# Patient Record
Sex: Male | Born: 1975 | Race: White | Hispanic: No | Marital: Single | Smoking: Former smoker
Health system: Southern US, Community
[De-identification: ages and names within clinical notes are randomized; demographics above are authoritative.]

## PROBLEM LIST (undated history)

## (undated) DIAGNOSIS — I10 Essential (primary) hypertension: Secondary | ICD-10-CM

---

## 2018-04-26 ENCOUNTER — Emergency Department: Payer: BLUE CROSS/BLUE SHIELD

## 2018-04-26 ENCOUNTER — Encounter: Payer: Self-pay | Admitting: Emergency Medicine

## 2018-04-26 ENCOUNTER — Other Ambulatory Visit: Payer: Self-pay

## 2018-04-26 ENCOUNTER — Inpatient Hospital Stay
Admission: EM | Admit: 2018-04-26 | Discharge: 2018-04-28 | DRG: 494 | Disposition: A | Discharge status: Home / Self Care | Payer: BLUE CROSS/BLUE SHIELD | Attending: Surgery | Admitting: Surgery

## 2018-04-26 ENCOUNTER — Inpatient Hospital Stay: Payer: BLUE CROSS/BLUE SHIELD

## 2018-04-26 DIAGNOSIS — Z882 Allergy status to sulfonamides status: Secondary | ICD-10-CM

## 2018-04-26 DIAGNOSIS — S82202A Unspecified fracture of shaft of left tibia, initial encounter for closed fracture: Secondary | ICD-10-CM | POA: Diagnosis present

## 2018-04-26 DIAGNOSIS — Z79899 Other long term (current) drug therapy: Secondary | ICD-10-CM | POA: Diagnosis not present

## 2018-04-26 DIAGNOSIS — Y9351 Activity, roller skating (inline) and skateboarding: Secondary | ICD-10-CM | POA: Diagnosis not present

## 2018-04-26 DIAGNOSIS — I10 Essential (primary) hypertension: Secondary | ICD-10-CM | POA: Diagnosis present

## 2018-04-26 DIAGNOSIS — S82292A Other fracture of shaft of left tibia, initial encounter for closed fracture: Principal | ICD-10-CM | POA: Diagnosis present

## 2018-04-26 DIAGNOSIS — S82492A Other fracture of shaft of left fibula, initial encounter for closed fracture: Secondary | ICD-10-CM | POA: Diagnosis present

## 2018-04-26 DIAGNOSIS — Z87891 Personal history of nicotine dependence: Secondary | ICD-10-CM

## 2018-04-26 DIAGNOSIS — T148XXA Other injury of unspecified body region, initial encounter: Secondary | ICD-10-CM

## 2018-04-26 HISTORY — DX: Essential (primary) hypertension: I10

## 2018-04-26 LAB — CBC WITH DIFFERENTIAL/PLATELET
Abs Immature Granulocytes: 0.08 10*3/uL — ABNORMAL HIGH (ref 0.00–0.07)
BASOS ABS: 0.1 10*3/uL (ref 0.0–0.1)
Basophils Relative: 1 %
EOS ABS: 0.2 10*3/uL (ref 0.0–0.5)
Eosinophils Relative: 2 %
HCT: 43 % (ref 39.0–52.0)
Hemoglobin: 14.6 g/dL (ref 13.0–17.0)
IMMATURE GRANULOCYTES: 1 %
Lymphocytes Relative: 21 %
Lymphs Abs: 2.6 10*3/uL (ref 0.7–4.0)
MCH: 30.7 pg (ref 26.0–34.0)
MCHC: 34 g/dL (ref 30.0–36.0)
MCV: 90.5 fL (ref 80.0–100.0)
Monocytes Absolute: 0.8 10*3/uL (ref 0.1–1.0)
Monocytes Relative: 7 %
NEUTROS PCT: 68 %
NRBC: 0 % (ref 0.0–0.2)
Neutro Abs: 8.4 10*3/uL — ABNORMAL HIGH (ref 1.7–7.7)
PLATELETS: 328 10*3/uL (ref 150–400)
RBC: 4.75 MIL/uL (ref 4.22–5.81)
RDW: 11.9 % (ref 11.5–15.5)
WBC: 12.2 10*3/uL — AB (ref 4.0–10.5)

## 2018-04-26 LAB — COMPREHENSIVE METABOLIC PANEL
ALK PHOS: 67 U/L (ref 38–126)
ALT: 22 U/L (ref 0–44)
ANION GAP: 11 (ref 5–15)
AST: 27 U/L (ref 15–41)
Albumin: 4.5 g/dL (ref 3.5–5.0)
BILIRUBIN TOTAL: 1.1 mg/dL (ref 0.3–1.2)
BUN: 11 mg/dL (ref 6–20)
CALCIUM: 9 mg/dL (ref 8.9–10.3)
CO2: 25 mmol/L (ref 22–32)
Chloride: 104 mmol/L (ref 98–111)
Creatinine, Ser: 0.87 mg/dL (ref 0.61–1.24)
Glucose, Bld: 151 mg/dL — ABNORMAL HIGH (ref 70–99)
Potassium: 3.6 mmol/L (ref 3.5–5.1)
SODIUM: 140 mmol/L (ref 135–145)
TOTAL PROTEIN: 7.4 g/dL (ref 6.5–8.1)

## 2018-04-26 LAB — PROTIME-INR
INR: 1.02
Prothrombin Time: 13.3 seconds (ref 11.4–15.2)

## 2018-04-26 LAB — SURGICAL PCR SCREEN
MRSA, PCR: NEGATIVE
STAPHYLOCOCCUS AUREUS: NEGATIVE

## 2018-04-26 MED ORDER — HYDROMORPHONE HCL 1 MG/ML IJ SOLN
INTRAMUSCULAR | Status: AC
  Filled 2018-04-26: qty 1

## 2018-04-26 MED ORDER — ONDANSETRON HCL 4 MG/2ML IJ SOLN
4.0000 mg | Freq: Once | INTRAMUSCULAR | Status: AC
  Administered 2018-04-26: 4 mg via INTRAVENOUS

## 2018-04-26 MED ORDER — ONDANSETRON HCL 4 MG/2ML IJ SOLN
INTRAMUSCULAR | Status: AC
  Filled 2018-04-26: qty 2

## 2018-04-26 MED ORDER — HYDROMORPHONE HCL 1 MG/ML IJ SOLN
0.5000 mg | Freq: Once | INTRAMUSCULAR | Status: AC
  Administered 2018-04-26: 0.5 mg via INTRAVENOUS

## 2018-04-26 MED ORDER — GABAPENTIN 400 MG PO CAPS
400.0000 mg | ORAL_CAPSULE | Freq: Two times a day (BID) | ORAL | Status: DC
  Administered 2018-04-26: 400 mg via ORAL
  Filled 2018-04-26: qty 1

## 2018-04-26 MED ORDER — HYDROMORPHONE HCL 1 MG/ML IJ SOLN
1.0000 mg | Freq: Once | INTRAMUSCULAR | Status: AC
  Administered 2018-04-26: 1 mg via INTRAVENOUS
  Filled 2018-04-26: qty 1

## 2018-04-26 MED ORDER — MORPHINE SULFATE (PF) 2 MG/ML IV SOLN
1.0000 mg | INTRAVENOUS | Status: DC | PRN
  Administered 2018-04-26 – 2018-04-27 (×5): 1 mg via INTRAVENOUS
  Filled 2018-04-26 (×5): qty 1

## 2018-04-26 MED ORDER — HYDROMORPHONE HCL 1 MG/ML PO LIQD
2.0000 mg | Freq: Once | ORAL | Status: AC
  Administered 2018-04-26: 2 mg via ORAL

## 2018-04-26 MED ORDER — CLINDAMYCIN PHOSPHATE 900 MG/50ML IV SOLN
900.0000 mg | INTRAVENOUS | Status: AC
  Administered 2018-04-27: 900 mg via INTRAVENOUS
  Filled 2018-04-26: qty 50

## 2018-04-26 MED ORDER — CHLORHEXIDINE GLUCONATE 4 % EX LIQD
60.0000 mL | Freq: Once | CUTANEOUS | Status: AC
  Administered 2018-04-27: 4 via TOPICAL

## 2018-04-26 MED ORDER — CEFAZOLIN SODIUM-DEXTROSE 2-4 GM/100ML-% IV SOLN
2.0000 g | INTRAVENOUS | Status: AC
  Administered 2018-04-27: 2 g via INTRAVENOUS
  Filled 2018-04-26: qty 100

## 2018-04-26 MED ORDER — MUPIROCIN 2 % EX OINT: 1.0000 "application " | TOPICAL_OINTMENT | Freq: Two times a day (BID) | CUTANEOUS | Status: DC

## 2018-04-26 MED ORDER — HYDROMORPHONE HCL 1 MG/ML IJ SOLN
INTRAMUSCULAR | Status: AC
  Administered 2018-04-26: 2 mg
  Filled 2018-04-26: qty 1

## 2018-04-26 MED ORDER — MELOXICAM 7.5 MG PO TABS
15.0000 mg | ORAL_TABLET | ORAL | Status: AC
  Administered 2018-04-27: 15 mg via ORAL
  Filled 2018-04-26: qty 2

## 2018-04-26 MED ORDER — HYDROCODONE-ACETAMINOPHEN 7.5-325 MG PO TABS
1.0000 | ORAL_TABLET | ORAL | Status: DC | PRN
  Administered 2018-04-26: 1 via ORAL
  Filled 2018-04-26: qty 1

## 2018-04-26 MED ORDER — SODIUM CHLORIDE 0.9 % IV SOLN
INTRAVENOUS | Status: DC
  Administered 2018-04-26 – 2018-04-27 (×2): via INTRAVENOUS

## 2018-04-26 NOTE — ED Notes (Signed)
Long leg posterior and stirrup splint applied

## 2018-04-26 NOTE — ED Triage Notes (Signed)
Pt presents to ED via [facility] with c/o fall roller skating. Per EMS pt has obvious deformity approx 4-5 inchest above his ankle. Per EMS pt has of fentanyl en route. Per EMS +pulses and cap refill < 3 seconds. Pt denies hitting head or other injury after the fall.

## 2018-04-26 NOTE — Progress Notes (Signed)
Patient request a [facility] ortho surgeon to perform procedure.

## 2018-04-26 NOTE — ED Notes (Signed)
Unable to visualize deformity secondary to leg splint that is in place.

## 2018-04-26 NOTE — ED Notes (Signed)
Transport to floor room 137.AS

## 2018-04-26 NOTE — ED Provider Notes (Signed)
[facility] Emergency Department Provider Note   ____________________________________________   First MD Initiated Contact with Patient 04/26/18 1826     (approximate)  I have reviewed the triage vital signs and the nursing notes.   HISTORY  Chief Complaint Fall   Hpi Anthony Abbott is a 42 y.o. male who was rollerskating with his children.  He fell and twisted his leg up underneath of him.  He does not remember hitting anything but his leg.  He did not pass out did not hit his head.  He has a lot of pain in his leg.  He report he had to pull the leg out from underneath of him and straighten it out as it was crooked.  Pain is severe in the left lower leg.   Past Medical History:  Diagnosis Date  . Hypertension     Patient Active Problem List   Diagnosis Date Noted  . Closed left tibial fracture 04/26/2018    History reviewed. No pertinent surgical history.  Prior to Admission medications   Medication Sig Start Date End Date Taking? Authorizing Provider  ibuprofen (ADVIL,MOTRIN) 200 MG tablet Take 200-400 mg by mouth every 6 (six) hours as needed for mild pain.   Yes [provider]  losartan (COZAAR) 25 MG tablet Take 25 mg by mouth daily. 03/31/18  Yes [provider]    Allergies Sulfa antibiotics  History reviewed. No pertinent family history.  Social History Social History   Tobacco Use  . Smoking status: Former Games developer  . Smokeless tobacco: Never Used  Substance Use Topics  . Alcohol use: Yes    Comment: Socially  . Drug use: Not Currently    Review of Systems  Constitutional: No fever/chills Eyes: No visual changes. ENT: No sore throat. Cardiovascular: Denies chest pain. Respiratory: Denies shortness of breath. Gastrointestinal: No abdominal pain.  No nausea, no vomiting.  No diarrhea.  No constipation. Genitourinary: Negative for dysuria. Musculoskeletal: Negative for back pain. Skin: Negative for  rash. Neurological: Negative for headaches, focal weakness  ____________________________________________   PHYSICAL EXAM:  VITAL SIGNS: ED Triage Vitals  Enc Vitals Group     BP 04/26/18 1717 (!) 152/103     Pulse Rate 04/26/18 1715 (!) 103     Resp 04/26/18 1717 20     Temp 04/26/18 1717 98.2 F (36.8 C)     Temp Source 04/26/18 1717 Oral     SpO2 04/26/18 1715 98 %     Weight 04/26/18 1718 190 lb (86.2 kg)     Height 04/26/18 1718 5\' 11"  (1.803 m)     Head Circumference --      Peak Flow --      Pain Score 04/26/18 1718 8     Pain Loc --      Pain Edu? --      Excl. in GC? --     Constitutional: Alert and oriented.  In pain from his leg Eyes: Conjunctivae are normal.  Head: Atraumatic. Nose: No congestion/rhinnorhea. Mouth/Throat: Mucous membranes are moist.  Oropharynx non-erythematous. Neck: No stridor.  Cardiovascular: Normal rate, regular rhythm. Grossly normal heart sounds.  Good peripheral circulation. Respiratory: Normal respiratory effort.  No retractions. Lungs CTAB. Gastrointestinal: Soft and nontender. No distention. No abdominal bruits. No CVA tenderness. Musculoskeletal: Patient with deformity of left lower leg.  Capillary refill and distal pulse and sensation are intact. Neurologic:  Normal speech and language. No gross focal neurologic deficits are appreciated.  Skin:  Skin is  warm, dry and intact. No rash noted. Psychiatric: Mood and affect are normal. Speech and behavior are normal.  ____________________________________________   LABS (all labs ordered are listed, but only abnormal results are displayed)  Labs Reviewed  COMPREHENSIVE METABOLIC PANEL - Abnormal; Notable for the following components:      Result Value   Glucose, Bld 151 (*)    All other components within normal limits  CBC WITH DIFFERENTIAL/PLATELET - Abnormal; Notable for the following components:   WBC 12.2 (*)    Neutro Abs 8.4 (*)    Abs Immature Granulocytes 0.08 (*)    All  other components within normal limits   ____________________________________________  EKG EKG read and interpreted by me shows normal sinus rhythm rate of 99 normal axis normal EKG  ____________________________________________  RADIOLOGY  ED MD interpretation: X-ray read by radiology reviewed by me shows a comminuted mid shaft fracture of the fibula and a midshaft fracture of the tibia as well. Post reduction view shows somewhat better approximation of the fragments.  Official radiology report(s): Dg Tibia/fibula Left  Result Date: 04/26/2018 CLINICAL DATA:  Status post reduction of tibial and fibular fractures. EXAM: LEFT TIBIA AND FIBULA - 2 VIEW COMPARISON:  Radiographs of same day. FINDINGS: There is been significant improvement in alignment of comminuted fractures involving the distal left fibula and tibia. The leg has been splinted and immobilized. IMPRESSION: Improved alignment of comminuted fractures involving the distal left tibia and fibula. Electronically Signed   By: Lupita Raider, M.D.   On: 04/26/2018 19:44   Dg Tibia/fibula Left  Result Date: 04/26/2018 CLINICAL DATA:  Fall while roller-skating. EXAM: LEFT TIBIA AND FIBULA - 2 VIEW COMPARISON:  None. FINDINGS: Displaced and mildly angulated spiral fracture of the distal third of the tibia, approximately 10 cm above the ankle mortise. Slight comminution. 1 cm displacement between the proximal and distal fragments. Additional comminuted mid shaft fracture of the fibula, greater than 1 cm displacement of the proximal to distal fragments, mildly angulated IMPRESSION: Mildly comminuted and displaced fractures of the distal tibia and fibula. Electronically Signed   By: Elsie Stain M.D.   On: 04/26/2018 18:48    ____________________________________________   PROCEDURES  Procedure(s) performed: Discussed patient with Dr. Hyacinth Meeker.  He wants a posterior splint and a stirrup splint placed.  Patient given more pain medicine and  then a leg supported at the calf and lifted by the toes was wrapped with gauze under my direct supervision and then splinted as Dr. Hyacinth Meeker wished.  Patient tolerated this fairly well after having received the extra pain medications.  After the splinting was done sensation capillary refill and pulse remained intact in the foot.  X-rays show better approximation of the fracture.  Procedures  Critical Care performed: Critical care time 25 minutes.  This included supervising the splinting and holding the leg for the splinters. ____________________________________________   INITIAL IMPRESSION / ASSESSMENT AND PLAN / ED COURSE  \Warned the patient about the symptoms of compartment syndrome.  Patient will go upstairs Dr. Hyacinth Meeker plan on doing surgery on him tomorrow.       ____________________________________________   FINAL CLINICAL IMPRESSION(S) / ED DIAGNOSES  Final diagnoses:  Closed fracture of shaft of left tibia, unspecified fracture morphology, initial encounter     ED Discharge Orders    None       Note:  This document was prepared using Dragon voice recognition software and may include unintentional dictation errors.    Arnaldo Natal, MD  04/26/18 2109  

## 2018-04-26 NOTE — Progress Notes (Signed)
Called ED for report. Awaiting call back.

## 2018-04-27 ENCOUNTER — Inpatient Hospital Stay: Payer: BLUE CROSS/BLUE SHIELD

## 2018-04-27 ENCOUNTER — Other Ambulatory Visit: Payer: Self-pay

## 2018-04-27 ENCOUNTER — Inpatient Hospital Stay: Payer: BLUE CROSS/BLUE SHIELD | Admitting: Anesthesiology

## 2018-04-27 ENCOUNTER — Encounter
Admission: EM | Disposition: A | Discharge status: Home / Self Care | Payer: Self-pay | Source: Home / Self Care | Attending: Surgery

## 2018-04-27 ENCOUNTER — Encounter: Payer: Self-pay | Admitting: *Deleted

## 2018-04-27 HISTORY — PX: TIBIA IM NAIL INSERTION: SHX2516

## 2018-04-27 LAB — URINALYSIS, ROUTINE W REFLEX MICROSCOPIC
BACTERIA UA: NONE SEEN
Bilirubin Urine: NEGATIVE
Glucose, UA: NEGATIVE mg/dL
KETONES UR: NEGATIVE mg/dL
Leukocytes, UA: NEGATIVE
Nitrite: NEGATIVE
PROTEIN: NEGATIVE mg/dL
Specific Gravity, Urine: 1.011 (ref 1.005–1.030)
pH: 7 (ref 5.0–8.0)

## 2018-04-27 SURGERY — INSERTION, INTRAMEDULLARY ROD, TIBIA
Anesthesia: General | Site: Leg Lower | Laterality: Left

## 2018-04-27 MED ORDER — NEOMYCIN-POLYMYXIN B GU 40-200000 IR SOLN
Status: DC | PRN
  Administered 2018-04-27: 4 mL

## 2018-04-27 MED ORDER — MAGNESIUM HYDROXIDE 400 MG/5ML PO SUSP: 30.0000 mL | Freq: Every day | ORAL | Status: DC | PRN

## 2018-04-27 MED ORDER — OXYCODONE HCL 5 MG PO TABS: 5.0000 mg | ORAL_TABLET | Freq: Once | ORAL | Status: DC | PRN

## 2018-04-27 MED ORDER — METOCLOPRAMIDE HCL 5 MG/ML IJ SOLN: 5.0000 mg | Freq: Three times a day (TID) | INTRAMUSCULAR | Status: DC | PRN

## 2018-04-27 MED ORDER — ROCURONIUM BROMIDE 100 MG/10ML IV SOLN
INTRAVENOUS | Status: DC | PRN
  Administered 2018-04-27: 50 mg via INTRAVENOUS

## 2018-04-27 MED ORDER — FENTANYL CITRATE (PF) 100 MCG/2ML IJ SOLN
INTRAMUSCULAR | Status: AC
  Filled 2018-04-27: qty 2

## 2018-04-27 MED ORDER — HYDROMORPHONE HCL 1 MG/ML IJ SOLN: 0.5000 mg | INTRAMUSCULAR | Status: DC | PRN

## 2018-04-27 MED ORDER — ONDANSETRON HCL 4 MG/2ML IJ SOLN: 4.0000 mg | Freq: Four times a day (QID) | INTRAMUSCULAR | Status: DC | PRN

## 2018-04-27 MED ORDER — KETOROLAC TROMETHAMINE 15 MG/ML IJ SOLN
15.0000 mg | Freq: Four times a day (QID) | INTRAMUSCULAR | Status: AC
  Administered 2018-04-27 – 2018-04-28 (×4): 15 mg via INTRAVENOUS
  Filled 2018-04-27 (×4): qty 1

## 2018-04-27 MED ORDER — PROPOFOL 10 MG/ML IV BOLUS
INTRAVENOUS | Status: DC | PRN
  Administered 2018-04-27: 150 mg via INTRAVENOUS

## 2018-04-27 MED ORDER — TRAMADOL HCL 50 MG PO TABS
50.0000 mg | ORAL_TABLET | Freq: Four times a day (QID) | ORAL | Status: DC | PRN
  Administered 2018-04-27 – 2018-04-28 (×2): 50 mg via ORAL
  Filled 2018-04-27 (×2): qty 1

## 2018-04-27 MED ORDER — ONDANSETRON HCL 4 MG/2ML IJ SOLN
INTRAMUSCULAR | Status: DC | PRN
  Administered 2018-04-27: 4 mg via INTRAVENOUS

## 2018-04-27 MED ORDER — FENTANYL CITRATE (PF) 100 MCG/2ML IJ SOLN: 25.0000 ug | INTRAMUSCULAR | Status: DC | PRN

## 2018-04-27 MED ORDER — SODIUM CHLORIDE 0.9 % IV SOLN
INTRAVENOUS | Status: DC
  Administered 2018-04-27: 18:00:00 via INTRAVENOUS

## 2018-04-27 MED ORDER — PROMETHAZINE HCL 25 MG/ML IJ SOLN: 6.2500 mg | INTRAMUSCULAR | Status: DC | PRN

## 2018-04-27 MED ORDER — MIDAZOLAM HCL 2 MG/2ML IJ SOLN
INTRAMUSCULAR | Status: DC | PRN
  Administered 2018-04-27: 2 mg via INTRAVENOUS

## 2018-04-27 MED ORDER — ROPIVACAINE HCL 5 MG/ML IJ SOLN
INTRAMUSCULAR | Status: DC | PRN
  Administered 2018-04-27: 30 mL via PERINEURAL

## 2018-04-27 MED ORDER — DEXAMETHASONE SODIUM PHOSPHATE 10 MG/ML IJ SOLN
INTRAMUSCULAR | Status: DC | PRN
  Administered 2018-04-27: 10 mg via INTRAVENOUS

## 2018-04-27 MED ORDER — DIPHENHYDRAMINE HCL 12.5 MG/5ML PO ELIX: 12.5000 mg | ORAL_SOLUTION | ORAL | Status: DC | PRN

## 2018-04-27 MED ORDER — OXYCODONE HCL 5 MG/5ML PO SOLN: 5.0000 mg | Freq: Once | ORAL | Status: DC | PRN

## 2018-04-27 MED ORDER — HYDROMORPHONE HCL 1 MG/ML IJ SOLN
INTRAMUSCULAR | Status: DC | PRN
  Administered 2018-04-27 (×2): 0.5 mg via INTRAVENOUS

## 2018-04-27 MED ORDER — DEXAMETHASONE SODIUM PHOSPHATE 10 MG/ML IJ SOLN
INTRAMUSCULAR | Status: AC
  Filled 2018-04-27: qty 1

## 2018-04-27 MED ORDER — EPINEPHRINE PF 1 MG/ML IJ SOLN
INTRAMUSCULAR | Status: AC
  Filled 2018-04-27: qty 1

## 2018-04-27 MED ORDER — BUPIVACAINE HCL (PF) 0.5 % IJ SOLN
INTRAMUSCULAR | Status: DC | PRN
  Administered 2018-04-27: 20 mL

## 2018-04-27 MED ORDER — METOCLOPRAMIDE HCL 10 MG PO TABS: 5.0000 mg | ORAL_TABLET | Freq: Three times a day (TID) | ORAL | Status: DC | PRN

## 2018-04-27 MED ORDER — ACETAMINOPHEN 10 MG/ML IV SOLN
INTRAVENOUS | Status: AC
  Filled 2018-04-27: qty 100

## 2018-04-27 MED ORDER — DOCUSATE SODIUM 100 MG PO CAPS
100.0000 mg | ORAL_CAPSULE | Freq: Two times a day (BID) | ORAL | Status: DC
  Administered 2018-04-27 – 2018-04-28 (×2): 100 mg via ORAL
  Filled 2018-04-27 (×2): qty 1

## 2018-04-27 MED ORDER — NEOMYCIN-POLYMYXIN B GU 40-200000 IR SOLN
Status: AC
  Filled 2018-04-27: qty 20

## 2018-04-27 MED ORDER — CEFAZOLIN SODIUM-DEXTROSE 2-4 GM/100ML-% IV SOLN
2.0000 g | Freq: Four times a day (QID) | INTRAVENOUS | Status: AC
  Administered 2018-04-27 – 2018-04-28 (×3): 2 g via INTRAVENOUS
  Filled 2018-04-27 (×3): qty 100

## 2018-04-27 MED ORDER — LIDOCAINE HCL (PF) 2 % IJ SOLN
INTRAMUSCULAR | Status: AC
  Filled 2018-04-27: qty 10

## 2018-04-27 MED ORDER — ACETAMINOPHEN 500 MG PO TABS
1000.0000 mg | ORAL_TABLET | Freq: Four times a day (QID) | ORAL | Status: DC
  Administered 2018-04-27 – 2018-04-28 (×3): 1000 mg via ORAL
  Filled 2018-04-27 (×4): qty 2

## 2018-04-27 MED ORDER — OXYCODONE HCL 5 MG PO TABS
5.0000 mg | ORAL_TABLET | ORAL | Status: DC | PRN
  Administered 2018-04-28: 5 mg via ORAL
  Filled 2018-04-27: qty 1

## 2018-04-27 MED ORDER — ONDANSETRON HCL 4 MG/2ML IJ SOLN
4.0000 mg | Freq: Four times a day (QID) | INTRAMUSCULAR | Status: DC | PRN
  Filled 2018-04-27: qty 2

## 2018-04-27 MED ORDER — HYDROMORPHONE HCL 1 MG/ML IJ SOLN
INTRAMUSCULAR | Status: AC
  Filled 2018-04-27: qty 1

## 2018-04-27 MED ORDER — SUGAMMADEX SODIUM 200 MG/2ML IV SOLN
INTRAVENOUS | Status: DC | PRN
  Administered 2018-04-27: 200 mg via INTRAVENOUS

## 2018-04-27 MED ORDER — ONDANSETRON HCL 4 MG/2ML IJ SOLN
INTRAMUSCULAR | Status: AC
  Filled 2018-04-27: qty 2

## 2018-04-27 MED ORDER — LIDOCAINE HCL (PF) 1 % IJ SOLN
INTRAMUSCULAR | Status: AC
  Filled 2018-04-27: qty 5

## 2018-04-27 MED ORDER — ACETAMINOPHEN 325 MG PO TABS: 325.0000 mg | ORAL_TABLET | Freq: Four times a day (QID) | ORAL | Status: DC | PRN

## 2018-04-27 MED ORDER — FENTANYL CITRATE (PF) 100 MCG/2ML IJ SOLN
INTRAMUSCULAR | Status: DC | PRN
  Administered 2018-04-27: 100 ug via INTRAVENOUS

## 2018-04-27 MED ORDER — PROPOFOL 500 MG/50ML IV EMUL
INTRAVENOUS | Status: AC
  Filled 2018-04-27: qty 50

## 2018-04-27 MED ORDER — ACETAMINOPHEN 10 MG/ML IV SOLN
INTRAVENOUS | Status: DC | PRN
  Administered 2018-04-27: 1000 mg via INTRAVENOUS

## 2018-04-27 MED ORDER — BUPIVACAINE HCL (PF) 0.5 % IJ SOLN
INTRAMUSCULAR | Status: AC
  Filled 2018-04-27: qty 30

## 2018-04-27 MED ORDER — MEPERIDINE HCL 50 MG/ML IJ SOLN: 6.2500 mg | INTRAMUSCULAR | Status: DC | PRN

## 2018-04-27 MED ORDER — MIDAZOLAM HCL 2 MG/2ML IJ SOLN
INTRAMUSCULAR | Status: AC
  Filled 2018-04-27: qty 2

## 2018-04-27 MED ORDER — BISACODYL 10 MG RE SUPP: 10.0000 mg | Freq: Every day | RECTAL | Status: DC | PRN

## 2018-04-27 MED ORDER — FLEET ENEMA 7-19 GM/118ML RE ENEM: 1.0000 | ENEMA | Freq: Once | RECTAL | Status: DC | PRN

## 2018-04-27 MED ORDER — ONDANSETRON HCL 4 MG PO TABS: 4.0000 mg | ORAL_TABLET | Freq: Four times a day (QID) | ORAL | Status: DC | PRN

## 2018-04-27 MED ORDER — LIDOCAINE HCL (CARDIAC) PF 100 MG/5ML IV SOSY
PREFILLED_SYRINGE | INTRAVENOUS | Status: DC | PRN
  Administered 2018-04-27: 100 mg via INTRAVENOUS

## 2018-04-27 MED ORDER — ENOXAPARIN SODIUM 40 MG/0.4ML ~~LOC~~ SOLN
40.0000 mg | SUBCUTANEOUS | Status: DC
  Administered 2018-04-28: 40 mg via SUBCUTANEOUS
  Filled 2018-04-27: qty 0.4

## 2018-04-27 MED ORDER — ROPIVACAINE HCL 5 MG/ML IJ SOLN
INTRAMUSCULAR | Status: AC
  Filled 2018-04-27: qty 30

## 2018-04-27 SURGICAL SUPPLY — 56 items
BANDAGE ACE 4X5 VEL STRL LF (GAUZE/BANDAGES/DRESSINGS) ×3 IMPLANT
BANDAGE ACE 6X5 VEL STRL LF (GAUZE/BANDAGES/DRESSINGS) ×3 IMPLANT
BIT DRILL 3.8X6 NS (BIT) ×2 IMPLANT
BIT DRILL 4.4 NS (BIT) ×2 IMPLANT
BLADE SURG SZ10 CARB STEEL (BLADE) ×6 IMPLANT
BNDG CMPR 75X41 PLY HI ABS (GAUZE/BANDAGES/DRESSINGS) ×1
BNDG ESMARK 6X12 TAN STRL LF (GAUZE/BANDAGES/DRESSINGS) ×3 IMPLANT
BNDG PLASTER FAST 4X5 WHT LF (CAST SUPPLIES) ×6 IMPLANT
BNDG PLASTER FAST 6X5 WHT LF (CAST SUPPLIES) ×4 IMPLANT
BNDG PLSTR 5X4 FST ST WHT LF (CAST SUPPLIES) ×2
BNDG PLSTR 5X6 FST ST WHT LF (CAST SUPPLIES) ×1
BNDG STRETCH 4X75 STRL LF (GAUZE/BANDAGES/DRESSINGS) ×2 IMPLANT
CANISTER SUCT 1200ML W/VALVE (MISCELLANEOUS) ×3 IMPLANT
CHLORAPREP W/TINT 26ML (MISCELLANEOUS) ×3 IMPLANT
COVER WAND RF STERILE (DRAPES) ×3 IMPLANT
CUFF TOURN 24 STER (MISCELLANEOUS) IMPLANT
CUFF TOURN 30 STER DUAL PORT (MISCELLANEOUS) IMPLANT
DRAPE C-ARM XRAY 36X54 (DRAPES) ×3 IMPLANT
DRAPE C-ARMOR (DRAPES) ×2 IMPLANT
DRAPE SHEET LG 3/4 BI-LAMINATE (DRAPES) ×6 IMPLANT
DRAPE TABLE BACK 80X90 (DRAPES) ×3 IMPLANT
DRAPE U-SHAPE 47X51 STRL (DRAPES) ×3 IMPLANT
DRSG OPSITE POSTOP 4X6 (GAUZE/BANDAGES/DRESSINGS) ×2 IMPLANT
ELECT REM PT RETURN 9FT ADLT (ELECTROSURGICAL) ×3
ELECTRODE REM PT RTRN 9FT ADLT (ELECTROSURGICAL) ×1 IMPLANT
GAUZE PETRO XEROFOAM 1X8 (MISCELLANEOUS) ×3 IMPLANT
GAUZE SPONGE 4X4 12PLY STRL (GAUZE/BANDAGES/DRESSINGS) ×3 IMPLANT
GAUZE XEROFORM 4X4 STRL (GAUZE/BANDAGES/DRESSINGS) ×2 IMPLANT
GLOVE BIO SURGEON STRL SZ8 (GLOVE) ×6 IMPLANT
GLOVE INDICATOR 8.0 STRL GRN (GLOVE) ×3 IMPLANT
GLOVE SURG XRAY 8.0 LX (GLOVE) ×3 IMPLANT
GOWN STRL REUS W/ TWL LRG LVL3 (GOWN DISPOSABLE) ×1 IMPLANT
GOWN STRL REUS W/ TWL XL LVL3 (GOWN DISPOSABLE) ×1 IMPLANT
GOWN STRL REUS W/TWL LRG LVL3 (GOWN DISPOSABLE) ×3
GOWN STRL REUS W/TWL XL LVL3 (GOWN DISPOSABLE) ×3
GUIDEPIN 3.2X17.5 THRD DISP (PIN) ×2 IMPLANT
GUIDEWIRE BALL NOSE 80CM (WIRE) ×2 IMPLANT
KIT TURNOVER KIT A (KITS) ×3 IMPLANT
NAIL TIBIAL 9MMX36CM (Nail) ×2 IMPLANT
NEEDLE HYPO 22GX1.5 SAFETY (NEEDLE) ×2 IMPLANT
NS IRRIG 1000ML POUR BTL (IV SOLUTION) ×3 IMPLANT
PAD ABD DERMACEA PRESS 5X9 (GAUZE/BANDAGES/DRESSINGS) ×4 IMPLANT
PADDING CAST BLEND 4X4 NS (MISCELLANEOUS) ×9 IMPLANT
SCREW ACECAP 36MM (Screw) ×2 IMPLANT
SCREW ACECAP 44MM (Screw) ×2 IMPLANT
SCREW ACECAP 48MM (Screw) ×2 IMPLANT
SCREW PROXIMAL DEPUY (Screw) ×6 IMPLANT
SCREW PRXML FT 40X5.5XNS LF (Screw) IMPLANT
SCREW PRXML FT 60X5.5XNS LF (Screw) IMPLANT
SPLINT CAST 1 STEP 5X30 WHT (MISCELLANEOUS) ×4 IMPLANT
SPONGE LAP 18X18 RF (DISPOSABLE) ×3 IMPLANT
STAPLER SKIN PROX 35W (STAPLE) ×3 IMPLANT
STRAP SAFETY 5IN WIDE (MISCELLANEOUS) ×3 IMPLANT
SUT VIC AB 0 CT1 36 (SUTURE) ×3 IMPLANT
SUT VIC AB 2-0 CT1 (SUTURE) ×3 IMPLANT
SYR 10ML LL (SYRINGE) ×2 IMPLANT

## 2018-04-27 NOTE — Anesthesia Procedure Notes (Signed)
Procedure Name: Intubation Date/Time: 04/27/2018 12:29 PM Performed by: Ginger Carne, CRNA Pre-anesthesia Checklist: Patient identified, Emergency Drugs available, Suction available, Patient being monitored and Timeout performed Patient Re-evaluated:Patient Re-evaluated prior to induction Oxygen Delivery Method: Circle system utilized Preoxygenation: Pre-oxygenation with 100% oxygen Induction Type: IV induction Ventilation: Mask ventilation without difficulty and Oral airway inserted - appropriate to patient size Laryngoscope Size: Hyacinth Meeker, 2, McGraph and 3 Grade View: Grade II Tube type: Oral Tube size: 7.5 mm Number of attempts: 2 Airway Equipment and Method: Stylet,  Video-laryngoscopy and Oral airway Placement Confirmation: ETT inserted through vocal cords under direct vision,  positive ETCO2 and breath sounds checked- equal and bilateral Secured at: 21 cm Tube secured with: Tape Dental Injury: Teeth and Oropharynx as per pre-operative assessment and Injury to lip  Difficulty Due To: Difficulty was unanticipated, Difficult Airway- due to anterior larynx and Difficult Airway- due to limited oral opening

## 2018-04-27 NOTE — Op Note (Signed)
04/27/2018  2:39 PM  Patient:   Anthony Abbott  Pre-Op Diagnosis:   Closed displaced midshaft left tib-fib fracture.Marland Kitchen  Post-Op Diagnosis:   Same.  Procedure:   Closed reduction and intramedullary nailing of displaced left midshaft tibial fracture.  Surgeon:   Maryagnes Amos, MD  Assistant:   Joni Fears, PA-S  Anesthesia:   GET  Findings:   As above.  Complications:   None  EBL:   200 cc  Fluids:   650 cc crystalloid  UOP:   None  TT:   None  Drains:   None  Closure:   Staples  Implants:   Biomet 12 x 345 mm Versanail with 3 proximal and 3 distal interlocking screws.  Brief Clinical Note:   The patient is a 42 year old male who sustained the above-noted injury yesterday afternoon while rollerskating. He presented to the emergency room where x-rays demonstrated the above-noted injury. He was placed in a posterior splint with sugar tong supplement and admitted for pain control. The patient presents at this time for definitive management of this injury.  Procedure:   The patient was brought into the operating room and lain in the supine position. After adequate general endotracheal intubation and anesthesia were obtained, the right lower extremity and foot were prepped with ChloraPrep solution before being draped sterilely. Preoperative antibiotics were administered. A timeout was obtained to verify the appropriate surgical site before an approximately 7-8 cm incision was made longitudinally over the anterior aspect of the knee beginning at the inferior pole of the patella and extending just past the tibial tubercle. The incision was carried down through the subcutaneous tissues to expose the patellar tendon. The medial border the patella tendon was identified and this plane developed down to the proximal tibia. A small amount of fat pad was excised to optimize visualization. Under fluoroscopic guidance, a drill bit was placed into the proximal tibia at the midpoint of the tibia  medial laterally and just on the anterior aspect of the plateau in the sagittal plane. After assessing the adequacy of pin position, the drill bit was overreamed with the initial reamer. The beaded guidewire was passed down the tibial shaft, across the fracture site, and into the distal fragment, again under fluoroscopic visualization. The adequacy of pin position was verified fluoroscopically in AP and lateral projections and found to be excellent. The guidewire was measured and found to be 37 cm. The tibial canal then was reamed sequentially beginning with an 8 mm flexible reamer and progressing to a 10.5 mm flexible reamer. The 9 x 360 mm tibial nail was inserted and advanced to within 1 cm of the tibial plafond, monitoring the progression of the nail and the adequacy of fracture alignment in both AP and lateral projections using fluoroscopic imaging while advancing the nail. Once the nail was positioned appropriately, two proximal interlocking screws of appropriate length were placed utilizing the proximal tibial guide. Distally, utilizing the perfect circle technique, two interlocking screws of appropriate length were placed via appropriately placed stab incisions as well. Again, the overall construct was assessed fluoroscopically in AP and lateral projections and found to be excellent.  Note, of the more distal of the two distal interlocking screws, the initial screw that was placed was too long and it was replaced with a shorter screw.  The wounds were copiously irrigated with sterile saline solution using bulb irrigation before the medial retinacular layer was reapproximated using #0 Vicryl interrupted sutures. The subcutaneous tissues were closed in two layers  using 2-0 Vicryl interrupted sutures before the skin was closed using staples. The stab incisions also each were closed using staples. A total of 20 cc of 0.5% plain Sensorcaine was injected in and around each of the incisions to help with  postoperative analgesia. A sterile bulky dressing was applied to the knee and lower leg before the patient was placed into a posterior splint with sugar tong supplement maintaining the ankle in neutral dorsiflexion. The patient then was awakened, extubated, and returned to the recovery room in satisfactory condition after tolerating the procedure well.

## 2018-04-27 NOTE — Anesthesia Postprocedure Evaluation (Signed)
Anesthesia Post Note  Patient: Anthony Abbott  Procedure(s) Performed: INTRAMEDULLARY (IM) NAIL TIBIAL (Left Leg Lower)  Patient location during evaluation: PACU Anesthesia Type: General Level of consciousness: awake and alert Pain management: pain level controlled Vital Signs Assessment: post-procedure vital signs reviewed and stable Respiratory status: spontaneous breathing and respiratory function stable Cardiovascular status: stable Anesthetic complications: no     Last Vitals:  Vitals:   04/27/18 1446 04/27/18 1447  BP:  (!) 155/95  Pulse:  (!) 112  Resp:  16  Temp: (!) 36.4 C   SpO2:  100%    Last Pain:  Vitals:   04/27/18 1447  TempSrc:   PainSc: Asleep                 KEPHART,WILLIAM K

## 2018-04-27 NOTE — Progress Notes (Signed)
Pt returned to unit from PACU just before 5pm. Pt is A&Ox4, VSS. Ace splint intact to left leg. LLE is elevated and ice pack applied. Pt's mom at bedside.

## 2018-04-27 NOTE — Transfer of Care (Signed)
Immediate Anesthesia Transfer of Care Note  Patient: Anthony Abbott  Procedure(s) Performed: INTRAMEDULLARY (IM) NAIL TIBIAL (Left Leg Lower)  Patient Location: PACU  Anesthesia Type:General  Level of Consciousness: awake, alert  and oriented  Airway & Oxygen Therapy: Patient Spontanous Breathing and Patient connected to face mask oxygen  Post-op Assessment: Report given to RN and Post -op Vital signs reviewed and stable  Post vital signs: Reviewed and stable  Last Vitals:  Vitals Value Taken Time  BP 155/95 04/27/2018  2:47 PM  Temp    Pulse 111 04/27/2018  2:47 PM  Resp 17 04/27/2018  2:47 PM  SpO2 100 % 04/27/2018  2:47 PM  Vitals shown include unvalidated device data.  Last Pain:  Vitals:   04/27/18 1123  TempSrc: Tympanic  PainSc: 8       Patients Stated Pain Goal: 2 (04/27/18 0455)  Complications: No apparent anesthesia complications

## 2018-04-27 NOTE — Anesthesia Procedure Notes (Signed)
Anesthesia Regional Block: Popliteal block   Pre-Anesthetic Checklist: ,, timeout performed, Correct Patient, Correct Site, Correct Laterality, Correct Procedure, Correct Position, site marked, Risks and benefits discussed,  Surgical consent,  Pre-op evaluation,  At surgeon's request and post-op pain management  Laterality: Lower and Left  Prep: chloraprep       Needles:  Injection technique: Single-shot  Needle Type: Echogenic Stimulator Needle     Needle Length: 10cm  Needle Gauge: 20     Additional Needles:   Procedures:,,,, ultrasound used (permanent image in chart),,,,  Narrative:  Injection made incrementally with aspirations every 5 mL.  Performed by: Personally  Anesthesiologist: Yevette Edwards, MD  Additional Notes: Functioning IV was confirmed and monitors were applied.  An echogenic needle was used. Sterile prep,hand hygiene and sterile gloves were used. Minimal sedation used for procedure.   No paresthesia endorsed by patient during the procedure.  Negative aspiration and negative test dose prior to incremental administration of local anesthetic. The patient tolerated the procedure well with no immediate complications.

## 2018-04-27 NOTE — Progress Notes (Signed)
Spoke to Dr. Joice Lofts concerning patient's request to have [facility] ortho surgeon on the team. MD agreed and advise that the someone from the [facility] team will assess in the morning. Shortly after this conversation Dr. Ernest Pine called concerning this patient and confirmed that he will see the patient in the morning.

## 2018-04-27 NOTE — Addendum Note (Signed)
Addendum  created 04/27/18 1537 by Yevette Edwards, MD   Child order released for a procedure order, Intraprocedure Blocks edited, Intraprocedure Meds edited, Sign clinical note

## 2018-04-27 NOTE — Anesthesia Post-op Follow-up Note (Signed)
Anesthesia QCDR form completed.        

## 2018-04-27 NOTE — H&P (Signed)
Subjective:  Chief complaint: Left lower leg pain.  The patient is a 42 y.o. male who sustained an injury to the left lower leg yesterday afternoon while rollerskating. He does not recall exactly how it happened but he ended up trying to "unfold my leg from underneath me" he had significant pain so he was brought to the emergency room where x-rays demonstrated a displaced fracture of the left tibia and fibula. The leg was realigned and splinted, and the patient was admitted for definitive management of the injury. The patient denies any associated injury or loss of consciousness associated with the injury, and denies any light-headedness, loss of consciousness, chest pain, or shortness of breath which might have contributed to the injury. In addition, he denies any numbness or paresthesias to his leg at this time.  Patient Active Problem List   Diagnosis Date Noted  . Closed left tibial fracture 04/26/2018   Past Medical History:  Diagnosis Date  . Hypertension     History reviewed. No pertinent surgical history.  Medications Prior to Admission  Medication Sig Dispense Refill Last Dose  . ibuprofen (ADVIL,MOTRIN) 200 MG tablet Take 200-400 mg by mouth every 6 (six) hours as needed for mild pain.     Marland Kitchen losartan (COZAAR) 25 MG tablet Take 25 mg by mouth daily.  1    Allergies  Allergen Reactions  . Sulfa Antibiotics     Social History   Tobacco Use  . Smoking status: Former Games developer  . Smokeless tobacco: Never Used  Substance Use Topics  . Alcohol use: Yes    Comment: Socially    History reviewed. No pertinent family history.   Review of Systems: As noted above. The patient denies any chest pain, shortness of breath, nausea, vomiting, diarrhea, constipation, belly pain, blood in his/her stool, or burning with urination.  Objective: Temp:  [97.8 F (36.6 C)-98.2 F (36.8 C)] 98 F (36.7 C) (11/11 0747) Pulse Rate:  [82-108] 88 (11/11 0747) Resp:  [10-22] 20 (11/11 0747) BP:  (133-169)/(56-103) 152/89 (11/11 0747) SpO2:  [93 %-100 %] 99 % (11/11 0747) Weight:  [86.2 kg-89.4 kg] 89.4 kg (11/10 2214)  Physical Exam: General:  Alert, no acute distress Psychiatric:  Patient is competent for consent with normal mood and affect Cardiovascular:  RRR  Respiratory:  Clear to auscultation. No wheezing. Non-labored breathing GI:  Abdomen is soft and non-tender Skin:  No lesions in the area of chief complaint Neurologic:  Sensation intact distally Lymphatic:  No axillary or cervical lymphadenopathy  Orthopedic Exam:  Orthopedic examination is limited to the left lower extremity and foot.  The patient is a a posterior splint with a sugar tong supplement, maintaining the leg in neutral alignment.  Skin inspection at the proximal distal extent of the splint is unremarkable.  There is no swelling, erythema, ecchymosis, abrasions, or other skin normality is identified.  He is able to dorsiflex and plantarflex his toes, although range of motion is limited due to pain and apprehension.  Sensation is intact to all digits.  He has excellent capillary refill to all digits.  Imaging Review: Recent x-rays of the left tibia and fibula are available for review.  These films demonstrate displaced midshaft fractures of both the tibia and fibula without extension to either articular surface.  No lytic lesions or significant degenerative changes are noted.  Assessment: Closed displaced fractures of left tibia and fibular shafts.  Plan: The treatment options, including both surgical and nonsurgical choices, have been discussed in detail  with the patient and his family. The patient would like to proceed with surgical intervention to include an intramedullary nailing of the left tibial shaft fracture. The risks (including bleeding, infection, nerve and/or blood vessel injury, persistent or recurrent pain, loosening or failure of the metal rod, malunion and/or nonunion, leg length inequality, need  for further surgery to include possible hardware removal, blood clots, strokes, heart attacks or arrhythmias, pneumonia, etc.) and benefits of the surgical procedure were discussed. The patient states his understanding and agrees to proceed. He agrees to a blood transfusion if necessary. A formal written consent will be obtained by the nursing staff.

## 2018-04-27 NOTE — Anesthesia Preprocedure Evaluation (Signed)
Anesthesia Evaluation  Patient identified by MRN, date of birth, ID band Patient awake    Reviewed: Allergy & Precautions, NPO status , Patient's Chart, lab work & pertinent test results  History of Anesthesia Complications Negative for: history of anesthetic complications  Airway Mallampati: II  TM Distance: >3 FB Neck ROM: Full    Dental no notable dental hx.    Pulmonary neg sleep apnea, neg COPD, former smoker,    breath sounds clear to auscultation- rhonchi (-) wheezing      Cardiovascular Exercise Tolerance: Good hypertension, Pt. on medications (-) CAD, (-) Past MI, (-) Cardiac Stents and (-) CABG  Rhythm:Regular Rate:Normal - Systolic murmurs and - Diastolic murmurs    Neuro/Psych negative neurological ROS  negative psych ROS   GI/Hepatic negative GI ROS, Neg liver ROS,   Endo/Other  negative endocrine ROSneg diabetes  Renal/GU negative Renal ROS     Musculoskeletal negative musculoskeletal ROS (+)   Abdominal (+) - obese,   Peds  Hematology negative hematology ROS (+)   Anesthesia Other Findings Past Medical History: No date: Hypertension   Reproductive/Obstetrics                             Anesthesia Physical Anesthesia Plan  ASA: II  Anesthesia Plan: General   Post-op Pain Management:  Regional for Post-op pain   Induction: Intravenous  PONV Risk Score and Plan: 1 and Ondansetron and Midazolam  Airway Management Planned: Oral ETT  Additional Equipment:   Intra-op Plan:   Post-operative Plan: Extubation in OR  Informed Consent: I have reviewed the patients History and Physical, chart, labs and discussed the procedure including the risks, benefits and alternatives for the proposed anesthesia with the patient or authorized representative who has indicated his/her understanding and acceptance.   Dental advisory given  Plan Discussed with: CRNA and  Anesthesiologist  Anesthesia Plan Comments:         Anesthesia Quick Evaluation

## 2018-04-28 ENCOUNTER — Encounter: Payer: Self-pay | Admitting: Surgery

## 2018-04-28 MED ORDER — ASPIRIN EC 325 MG PO TBEC: 325.0000 mg | DELAYED_RELEASE_TABLET | Freq: Every day | ORAL | 0 refills | Status: AC

## 2018-04-28 MED ORDER — OXYCODONE HCL 5 MG PO TABS: 5.0000 mg | ORAL_TABLET | ORAL | 0 refills | Status: AC | PRN

## 2018-04-28 MED ORDER — TRAMADOL HCL 50 MG PO TABS: 50.0000 mg | ORAL_TABLET | Freq: Four times a day (QID) | ORAL | 0 refills | Status: AC | PRN

## 2018-04-28 NOTE — Care Management (Signed)
RNCM spoke with Anthony NoelLance PA regarding outpatient PT needs. Per Anthony Abbott, patient can just call his office to schedule PT appointment.  RNCM spoke with patient and he agrees. [facility].  [place] [place] [facility] [place] [facility] as patient thought the ones that were provided by PT was for him to take home.

## 2018-04-28 NOTE — Evaluation (Signed)
Physical Therapy Evaluation Patient Details Name: Anthony HoffJohn Abbott MRN: [id number] DOB: Jul 30, 1975 Today's Date: 04/28/2018   History of Present Illness  Pt is 42 yo male s/p INTRAMEDULLARY (IM) NAIL TIBIAL on 04/27/2018, NWB on LLE. PMH of HTN  Clinical Impression  Patient alert and eager to mobilize at start of session, states he has some pain in his LLE, 3/10, sensation intact, able to flex toes. Pt reported living in 2nd floor apartment with wife and children, previously independent in ADLs/IADLs, driving, working full time as a Investment banker, operationalchef.  Patient demonstrated bed mobility mod I. Pt needed mod verbal cues for proper sit <> stand transfers during session for sequencing of movements and hand placements with RW and axillary crutches. RW utilized during session initially to ensure NWB precautions on LLE. Ambulated ~6450ft with RW, appropriate adherence to NWB precautions. Verbal and visual cues for safe turns and pivoting needed. Ambulated ~3250ft with crutches with good  balance, occasional cues for AD management. First attempted stair navigation with bilateral rails with CGA/supervision, minimal verbal cues. Attempted with one rail due to pt being unsure of whether he can utilize both rails at once. CGA/supervision, more difficulty/effort noted but no LOB. The patient demonstrated changes from PLOF including limitations in mobility, balance, endurance, and strength and would benefit from skilled PT. Current recommendation is outpatient PT to address these limitations as well as further education for AD management and precautions.     Follow Up Recommendations Outpatient PT    Equipment Recommendations  Crutches    Recommendations for Other Services       Precautions / Restrictions Precautions Precautions: Fall Restrictions Weight Bearing Restrictions: Yes LLE Weight Bearing: Non weight bearing      Mobility  Bed Mobility Overal bed mobility: Modified Independent                 Transfers Overall transfer level: Needs assistance Equipment used: Crutches Transfers: Sit to/from Stand Sit to Stand: Supervision;Min guard         General transfer comment: mod verbal cues for AD management, hand placement during repetitive transfer attempts  Ambulation/Gait Ambulation/Gait assistance: Supervision;Min guard Gait Distance (Feet): 100 Feet Assistive device: Rolling walker (2 wheeled);Crutches       General Gait Details: Ambulated ~2750ft with RW, appropriate adherence to NWB precautions. Verbal and visual cues for safe turns and pivoting needed. Ambulated ~9050ft with crutches with good  balance, occasional cues for AD management.   Stairs Stairs: Yes Stairs assistance: Supervision;Min guard Stair Management: One rail Right;One rail Left;Two rails;Step to pattern;With crutches Number of Stairs: 8 General stair comments: First attempted with bilateral rails with CGA/supervision, minimal verbal cues. Attempted with one rail due to pt being unsure of whether he can utilize both rails at once. CGA/supervision, more difficulty/effort noted but no LOB.  Wheelchair Mobility    Modified Rankin (Stroke Patients Only)       Balance Overall balance assessment: Needs assistance Sitting-balance support: Single extremity supported;Feet supported(1 LE supported) Sitting balance-Leahy Scale: Good     Standing balance support: Bilateral upper extremity supported Standing balance-Leahy Scale: Good                               Pertinent Vitals/Pain Pain Assessment: 0-10 Pain Score: 3  Pain Location: L knee Pain Descriptors / Indicators: Aching;Discomfort Pain Intervention(s): Limited activity within patient's tolerance;Monitored during session;Repositioned    Home Living Family/patient expects to be discharged to:: Private residence Living  Arrangements: Spouse/significant other;Children Available Help at Discharge: Family;Friend(s) Type of Home:  Apartment Home Access: Stairs to enter Entrance Stairs-Rails: Lawyer of Steps: flight Home Layout: One level Home Equipment: None      Prior Function Level of Independence: Independent               Hand Dominance   Dominant Hand: Right    Extremity/Trunk Assessment   Upper Extremity Assessment Upper Extremity Assessment: Overall WFL for tasks assessed    Lower Extremity Assessment Lower Extremity Assessment: RLE deficits/detail;LLE deficits/detail RLE Deficits / Details: grossly 4/5 LLE: Unable to fully assess due to immobilization       Communication   Communication: No difficulties  Cognition Arousal/Alertness: Awake/alert Behavior During Therapy: WFL for tasks assessed/performed Overall Cognitive Status: Within Functional Limits for tasks assessed                                        General Comments      Exercises General Exercises - Lower Extremity Quad Sets: AROM;Left;10 reps;Strengthening Gluteal Sets: AROM;Left;10 reps;Strengthening Short Arc Quad: AROM;Left;Strengthening;5 reps;Other (comment)(painful, discontinued) Hip ABduction/ADduction: AROM;Strengthening;Left;10 reps Straight Leg Raises: AROM;Strengthening;Left;10 reps   Assessment/Plan    PT Assessment Patient needs continued PT services  PT Problem List Decreased strength;Decreased range of motion;Decreased knowledge of use of DME;Decreased activity tolerance;Decreased balance;Pain;Decreased mobility       PT Treatment Interventions DME instruction;Balance training;Gait training;Neuromuscular re-education;Stair training;Functional mobility training;Patient/family education;Therapeutic activities;Therapeutic exercise    PT Goals (Current goals can be found in the Care Plan section)  Acute Rehab PT Goals Patient Stated Goal: Pt would like to return to PLOF PT Goal Formulation: With patient Time For Goal Achievement: 05/12/18 Potential to  Achieve Goals: Fair    Frequency BID   Barriers to discharge        Co-evaluation               AM-PAC PT "6 Clicks" Daily Activity  Outcome Measure Difficulty turning over in bed (including adjusting bedclothes, sheets and blankets)?: None Difficulty moving from lying on back to sitting on the side of the bed? : A Little Difficulty sitting down on and standing up from a chair with arms (e.g., wheelchair, bedside commode, etc,.)?: A Little Help needed moving to and from a bed to chair (including a wheelchair)?: A Little Help needed walking in hospital room?: A Little Help needed climbing 3-5 steps with a railing? : A Little 6 Click Score: 19    End of Session Equipment Utilized During Treatment: Gait belt Activity Tolerance: Patient tolerated treatment well Patient left: with chair alarm set;with call bell/phone within reach;with SCD's reapplied;Other (comment)(heels elevated) Nurse Communication: Mobility status PT Visit Diagnosis: Unsteadiness on feet (R26.81);Difficulty in walking, not elsewhere classified (R26.2)    Time: 4540-9811 PT Time Calculation (min) (ACUTE ONLY): 40 min   Charges:   PT Evaluation $PT Eval Low Complexity: 1 Low PT Treatments $Gait Training: 8-22 mins $Therapeutic Activity: 8-22 mins        Olga Coaster PT, DPT 9:40 AM,04/28/18 [phone]

## 2018-04-28 NOTE — Progress Notes (Signed)
Clinical Social Worker (CSW) received SNF consult. PT is recommending outpatient PT. RN case manager aware of above. Please reconsult if future social work needs arise. CSW signing off.   Ivette Castronova, LCSW [phone]  

## 2018-04-28 NOTE — Discharge Summary (Signed)
Physician Discharge Summary  Patient ID: Anthony Abbott MRN: [id number] DOB/AGE: [birth year] 43 y.o.  Admit date: 04/26/2018 Discharge date: 04/28/2018  Admission Diagnoses:  Closed fracture of shaft of left tibia, unspecified fracture morphology, initial encounter [S82.202A]  Discharge Diagnoses: Patient Active Problem List   Diagnosis Date Noted  . Closed left tibial fracture 04/26/2018    Past Medical History:  Diagnosis Date  . Hypertension    Transfusion: None.   Consultants (if any): Treatment Team:  Poggi, Excell Seltzer, MD  Discharged Condition: Improved  Hospital Course: Anthony Abbott is an 42 y.o. male who was admitted 04/26/2018 with a diagnosis of a closed displaced midshaft left tib-fib fracture  and went to the operating room on 04/27/2018 and underwent the above named procedures.    Surgeries: Procedure(s): INTRAMEDULLARY (IM) NAIL TIBIAL on 04/27/2018 Patient tolerated the surgery well. Taken to PACU where she was stabilized and then transferred to the orthopedic floor.  Started on Lovenox 40mg  q 24 hrs. Heels elevated on bed with rolled towels. No evidence of DVT. Negative Homan to the right leg. Physical therapy started on day #1 for gait training and transfer. OT started day #1 for ADL and assisted devices.  Splint intact on POD1 to the left leg.  Patient's IV was removed on POD1.  Implants: Biomet 12 x 345 mm Versanail with 3 proximal and 3 distal interlocking screws.  He was given perioperative antibiotics:  Anti-infectives (From admission, onward)   Start     Dose/Rate Route Frequency Ordered Stop   04/27/18 1830  ceFAZolin (ANCEF) IVPB 2g/100 mL premix     2 g 200 mL/hr over 30 Minutes Intravenous Every 6 hours 04/27/18 1715 04/28/18 0729   04/27/18 0600  ceFAZolin (ANCEF) IVPB 2g/100 mL premix     2 g 200 mL/hr over 30 Minutes Intravenous On call to O.R. 04/26/18 2105 04/27/18 1251   04/27/18 0600  clindamycin (CLEOCIN) IVPB 900 mg     900 mg 100  mL/hr over 30 Minutes Intravenous On call to O.R. 04/26/18 2105 04/27/18 1245    .  He was given sequential compression devices, early ambulation, and lovenox for DVT prophylaxis.  He benefited maximally from the hospital stay and there were no complications.    Recent vital signs:  Vitals:   04/27/18 2142 04/28/18 0717  BP: 130/60 137/81  Pulse: 89 96  Resp: 17 18  Temp: 98.2 F (36.8 C) 98.1 F (36.7 C)  SpO2: 98% 99%    Recent laboratory studies:  Lab Results  Component Value Date   HGB 14.6 04/26/2018   Lab Results  Component Value Date   WBC 12.2 (H) 04/26/2018   PLT 328 04/26/2018   Lab Results  Component Value Date   INR 1.02 04/26/2018   Lab Results  Component Value Date   NA 140 04/26/2018   K 3.6 04/26/2018   CL 104 04/26/2018   CO2 25 04/26/2018   BUN 11 04/26/2018   CREATININE 0.87 04/26/2018   GLUCOSE 151 (H) 04/26/2018    Discharge Medications:   Allergies as of 04/28/2018      Reactions   Sulfa Antibiotics       Medication List    TAKE these medications   aspirin EC 325 MG tablet Take 1 tablet (325 mg total) by mouth daily.   ibuprofen 200 MG tablet Commonly known as:  ADVIL,MOTRIN Take 200-400 mg by mouth every 6 (six) hours as needed for mild pain.   losartan 25 MG tablet Commonly  known as:  COZAAR Take 25 mg by mouth daily.   oxyCODONE 5 MG immediate release tablet Commonly known as:  Oxy IR/ROXICODONE Take 1-2 tablets (5-10 mg total) by mouth every 4 (four) hours as needed for moderate pain (pain score 4-6).   traMADol 50 MG tablet Commonly known as:  ULTRAM Take 1 tablet (50 mg total) by mouth every 6 (six) hours as needed for moderate pain.       Diagnostic Studies: Dg Tibia/fibula Left  Result Date: 04/27/2018 CLINICAL DATA:  42 year old male undergoing left tibia ORIF. EXAM: LEFT TIBIA AND FIBULA - 2 VIEW COMPARISON:  04/26/2018 left tib-fib series. FLUOROSCOPY TIME:  1 minutes 12 seconds FINDINGS: 4 intraoperative  fluoroscopic spot views of the left tibia. Intramedullary rod is in place with 2 proximal and 2 distal interlocking cortical screws. Improved alignment about the distal shaft spiral fracture. Visible hardware appears intact. IMPRESSION: Left tibia ORIF with no adverse features identified. Electronically Signed   By: Odessa FlemingH  Hall M.D.   On: 04/27/2018 14:30   Dg Tibia/fibula Left  Result Date: 04/26/2018 CLINICAL DATA:  Status post reduction of tibial and fibular fractures. EXAM: LEFT TIBIA AND FIBULA - 2 VIEW COMPARISON:  Radiographs of same day. FINDINGS: There is been significant improvement in alignment of comminuted fractures involving the distal left fibula and tibia. The leg has been splinted and immobilized. IMPRESSION: Improved alignment of comminuted fractures involving the distal left tibia and fibula. Electronically Signed   By: Lupita RaiderJames  Green Jr, M.D.   On: 04/26/2018 19:44   Dg Tibia/fibula Left  Result Date: 04/26/2018 CLINICAL DATA:  Fall while roller-skating. EXAM: LEFT TIBIA AND FIBULA - 2 VIEW COMPARISON:  None. FINDINGS: Displaced and mildly angulated spiral fracture of the distal third of the tibia, approximately 10 cm above the ankle mortise. Slight comminution. 1 cm displacement between the proximal and distal fragments. Additional comminuted mid shaft fracture of the fibula, greater than 1 cm displacement of the proximal to distal fragments, mildly angulated IMPRESSION: Mildly comminuted and displaced fractures of the distal tibia and fibula. Electronically Signed   By: Elsie StainJohn T Curnes M.D.   On: 04/26/2018 18:48   Dg C-arm 1-60 Min-no Report  Result Date: 04/27/2018 Fluoroscopy was utilized by the requesting physician.  No radiographic interpretation.   Disposition: Plan for discharge home this afternoon pending progress with PT.  Follow-up Information    Poggi, Excell SeltzerJohn J, MD Follow up in 14 day(s).   Specialty:  Surgery Why:  Staple and splint removal. Contact information: [address] [facility] [place] [id number] [phone]          Signed: Meriel PicaJames L Daegen Berrocal PA-C 04/28/2018, 7:40 AM

## 2018-04-28 NOTE — Discharge Instructions (Signed)
Diet: As you were doing prior to hospitalization   Shower:  May shower but keep the wounds dry, use an occlusive plastic wrap, NO SOAKING IN TUB.  If the bandage gets wet, change with a clean dry gauze.  Dressing:  Remain in splint until first post-op appointment.   Activity:  Increase activity slowly as tolerated, but follow the weight bearing instructions below.  No lifting or driving for 6 weeks.  Weight Bearing:   Non-weightbearing to the left leg.  Blood Clot Prevention: Take 1 325mg  aspirin daily upon discharge.  To prevent constipation: you may use a stool softener such as -  Colace (over the counter) 100 mg by mouth twice a day  Drink plenty of fluids (prune juice may be helpful) and high fiber foods Miralax (over the counter) for constipation as needed.    Itching:  If you experience itching with your medications, try taking only a single pain pill, or even half a pain pill at a time.  You may take up to 10 pain pills per day, and you can also use benadryl over the counter for itching or also to help with sleep.   Precautions:  If you experience chest pain or shortness of breath - call 911 immediately for transfer to the hospital emergency department!!  If you develop a fever greater that 101 F, purulent drainage from wound, increased redness or drainage from wound, or calf pain-Call [facility]                                              Follow- Up Appointment:  Please call for an [facility]

## 2018-04-28 NOTE — Progress Notes (Signed)
Pt ready for discharge home today per MD. Patient assessment unchanged from this morning. Reviewed discharge instructions and prescriptions with pt; all questions answered and pt verbalized understanding. PIV removed, VSS. Waiting on crutches to be delivered and for pt's ride.   Anthony AmmonsKorie Wister Hoefle

## 2018-04-28 NOTE — Progress Notes (Signed)
  Subjective: 1 Day Post-Op Procedure(s) (LRB): INTRAMEDULLARY (IM) NAIL TIBIAL (Left) Patient reports pain as mild.   Patient is well, and has had no acute complaints or problems Plan is to go Home after hospital stay. Negative for chest pain and shortness of breath Fever: no Gastrointestinal:Negative for nausea and vomiting  Objective: Vital signs in last 24 hours: Temp:  [97.5 F (36.4 C)-99.2 F (37.3 C)] 98.1 F (36.7 C) (11/12 0717) Pulse Rate:  [75-112] 96 (11/12 0717) Resp:  [12-20] 18 (11/12 0717) BP: (123-155)/(60-95) 137/81 (11/12 0717) SpO2:  [96 %-100 %] 99 % (11/12 0717) Weight:  [89.4 kg] 89.4 kg (11/11 1123)  Intake/Output from previous day:  Intake/Output Summary (Last 24 hours) at 04/28/2018 0735 Last data filed at 04/28/2018 0300 Gross per 24 hour  Intake 2001.98 ml  Output 1400 ml  Net 601.98 ml    Intake/Output this shift: No intake/output data recorded.  Labs: Recent Labs    04/26/18 1721  HGB 14.6   Recent Labs    04/26/18 1721  WBC 12.2*  RBC 4.75  HCT 43.0  PLT 328   Recent Labs    04/26/18 1721  NA 140  K 3.6  CL 104  CO2 25  BUN 11  CREATININE 0.87  GLUCOSE 151*  CALCIUM 9.0   Recent Labs    04/26/18 2125  INR 1.02     EXAM General - Patient is Alert, Appropriate and Oriented Extremity - ABD soft Incision: dressing C/D/I  Able to flex and extend toes on command. Intact cap refill to each toe Decreased sensation in the first dorsal webspace at this time but full sensation along the rest of the dorsal foot and volar foot,  Dressing/Incision - Splint clean, dry, no drainage Motor Function - intact, moving toes well on exam. Abdomen soft with normal BS this AM.  Past Medical History:  Diagnosis Date  . Hypertension     Assessment/Plan: 1 Day Post-Op Procedure(s) (LRB): INTRAMEDULLARY (IM) NAIL TIBIAL (Left) Active Problems:   Closed left tibial fracture  Estimated body mass index is 27.49 kg/m as  calculated from the following:   Height as of this encounter: 5\' 11"  (1.803 m).   Weight as of this encounter: 89.4 kg. Advance diet Up with therapy D/C IV fluids when tolerating po intake.  Labs reviewed this AM. Passing gas without pain. Up with therapy. Plan for d/c home this afternoon.  DVT Prophylaxis - Lovenox and TED hose Non-weightbearing to the left leg.  Valeria BatmanJ. Lance , PA-C [facility] 04/28/2018, 7:35 AM

## 2018-05-04 ENCOUNTER — Telehealth: Payer: Self-pay | Admitting: Licensed Clinical Social Worker

## 2018-05-04 NOTE — Telephone Encounter (Signed)
EMMI flagged patient for answering yes to feeling sad/hopeless/anxious/empty. Clinical Child psychotherapistocial Worker (CSW) was able to contact patient via telephone. Patient scored 0 on PHQ-9. Patient reported that he is doing well and has his first outpatient PT appointment on Wednesday at Dr. Binnie RailPoggi's office. Patient reported that he is not depressed and is not having thoughts of hurting himself. Patient reported no needs or concerns.   Baker Hughes IncorporatedBailey Anab Vivar, LCSW [phone]

## 2019-06-14 IMAGING — CR DG TIBIA/FIBULA 2V*L*
4 series · 4 of 4 positions shown · non-contrast
Comparison: 04/26/2018 left tib-fib series.

FLUOROSCOPY TIME:  1 minutes 12 seconds

CLINICAL DATA: 42-year-old male undergoing left tibia ORIF.

EXAM:
LEFT TIBIA AND FIBULA - 2 VIEW

[cont. (1 of 4)]
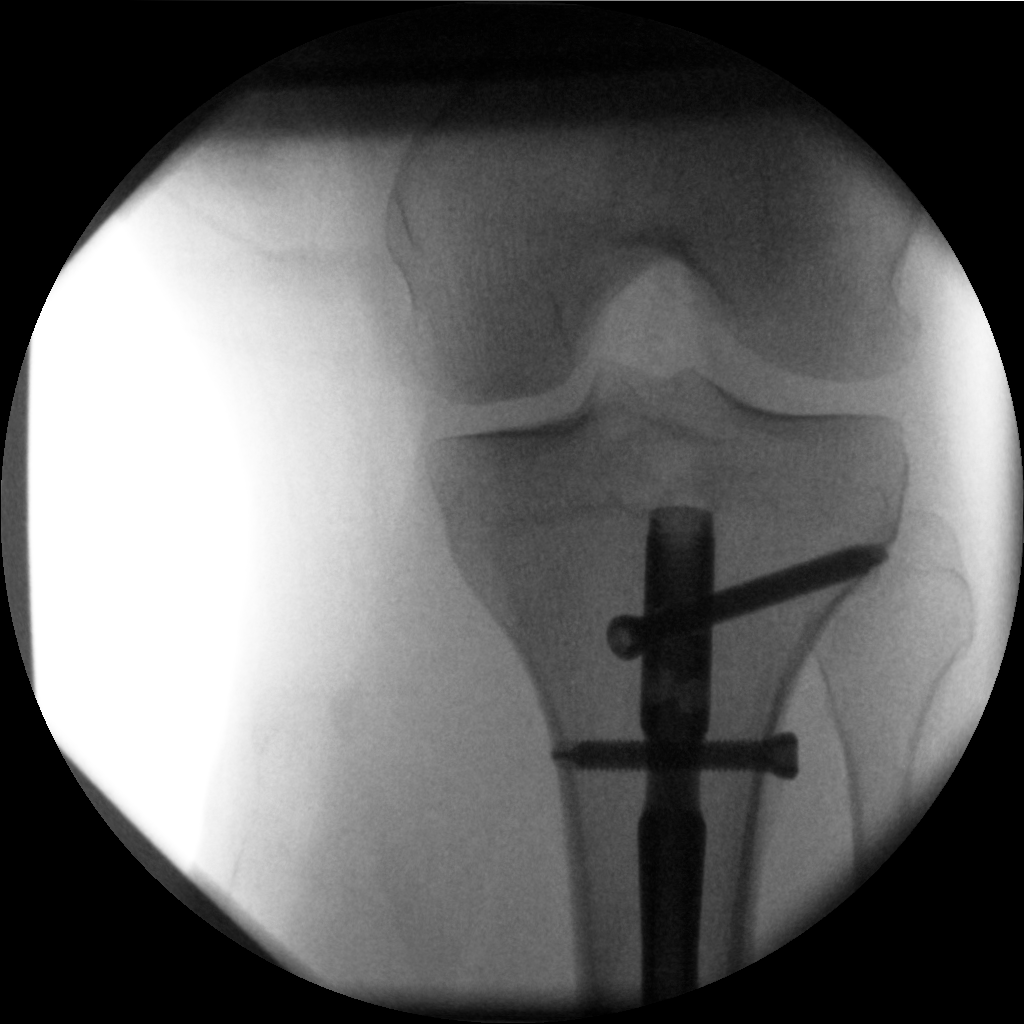

[cont. (2 of 4)]
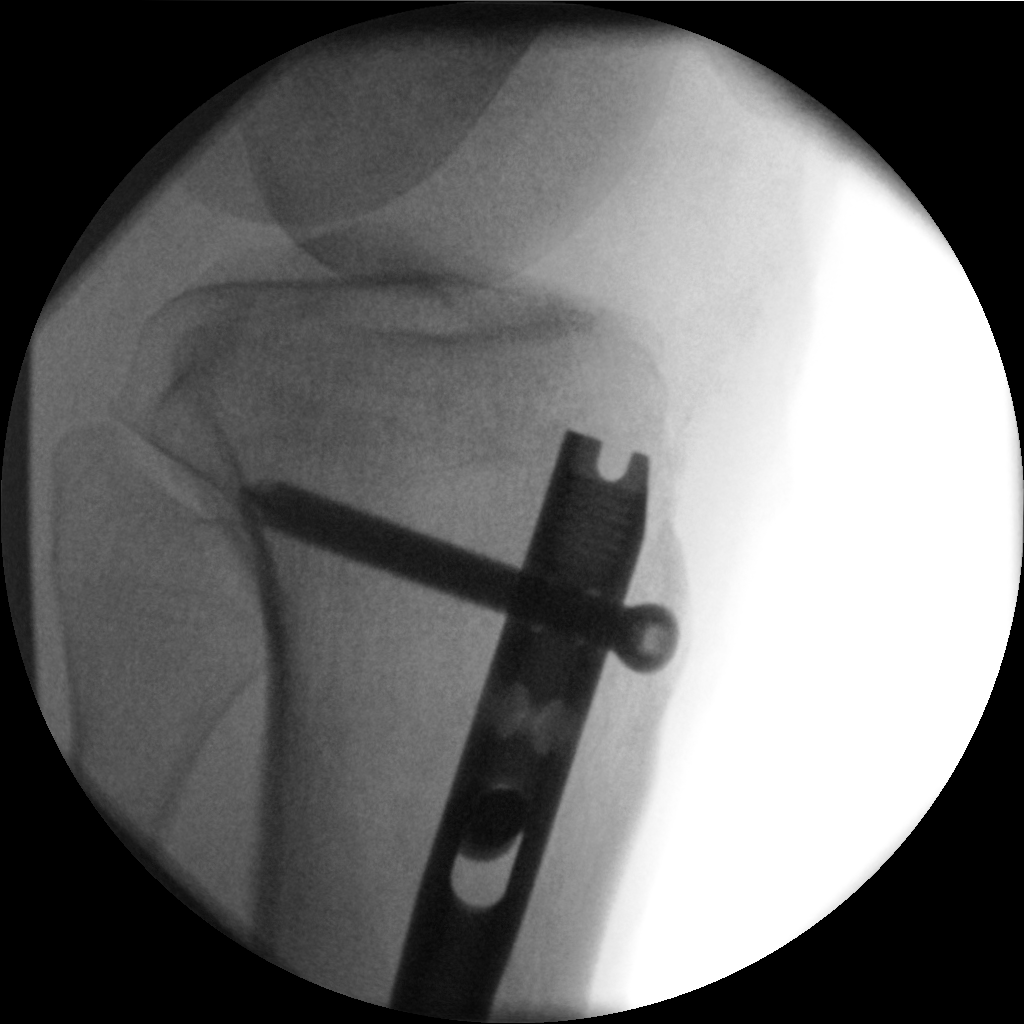

[cont. (3 of 4)]
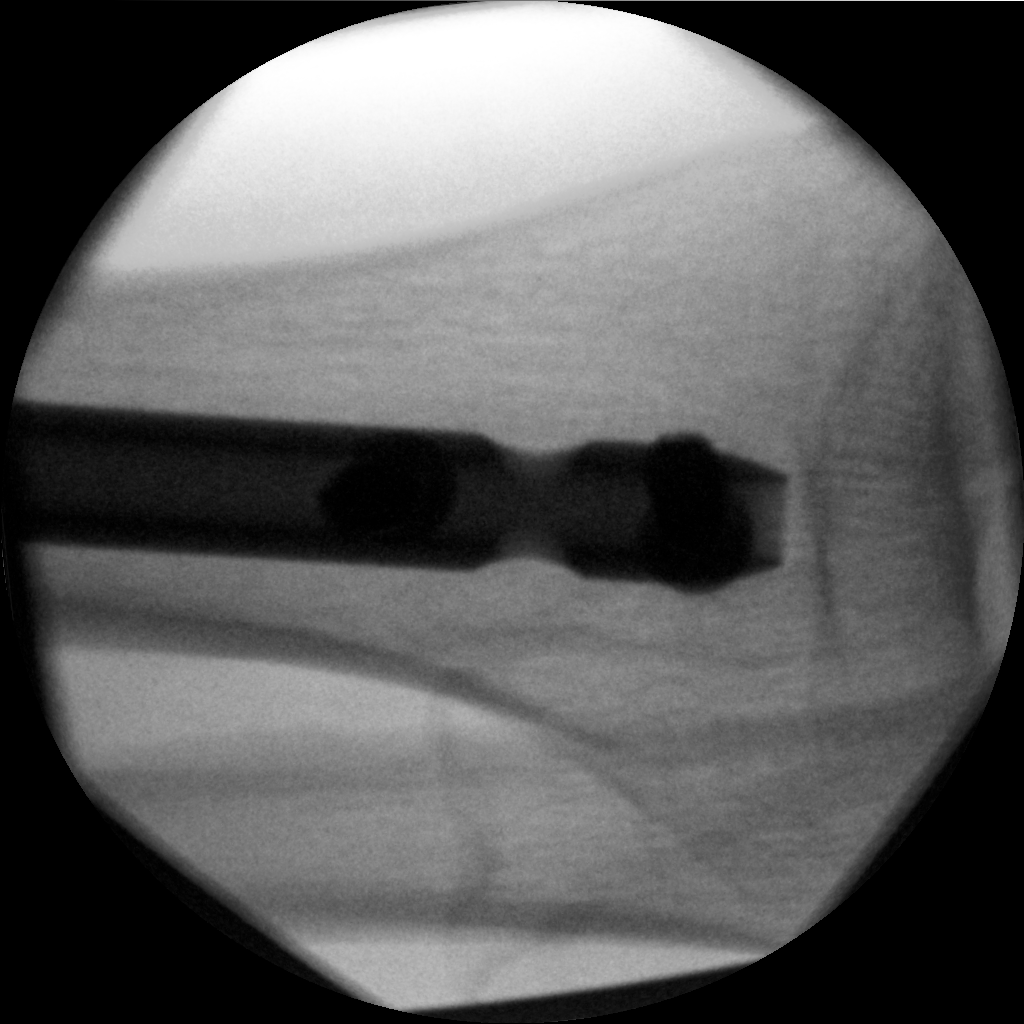

[cont. (4 of 4)]
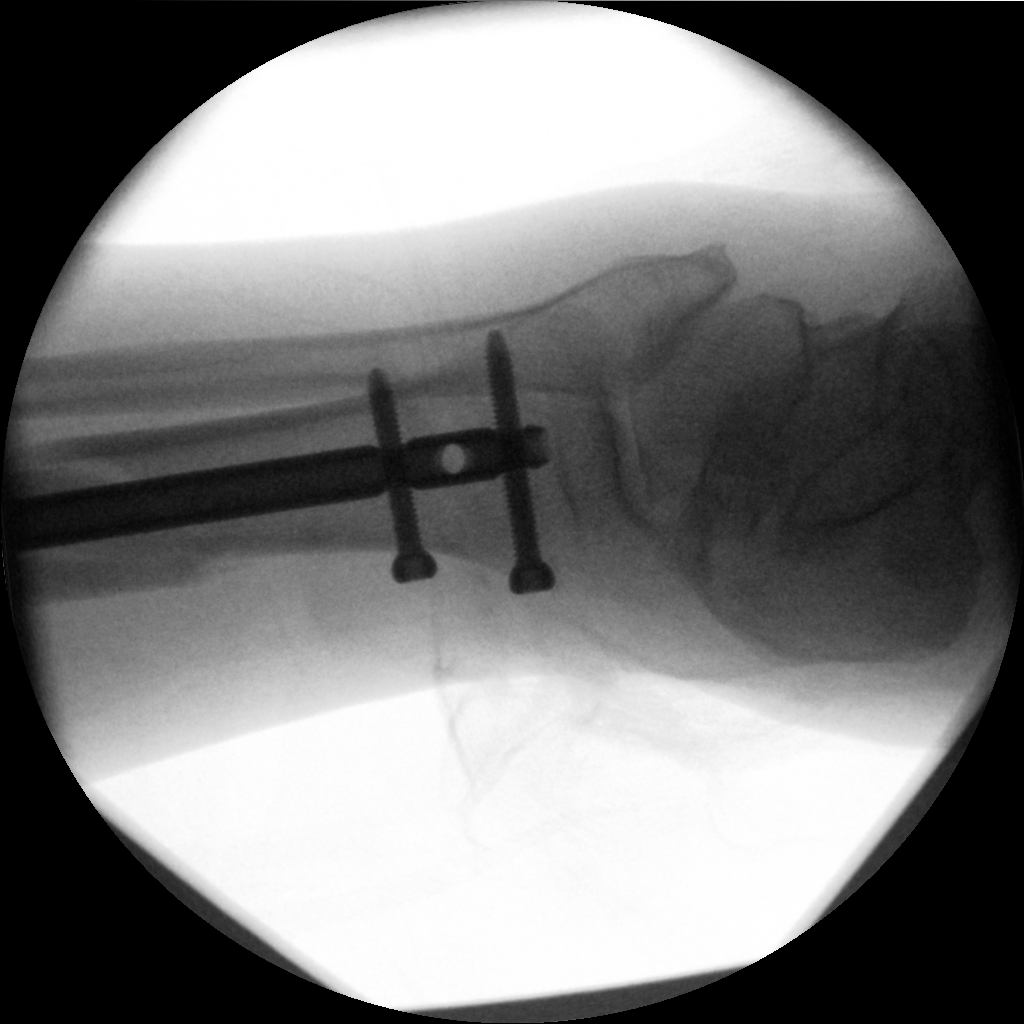

[4 of 4 positions shown; findings below may reference images not displayed]

FINDINGS: 4 intraoperative fluoroscopic spot views of the left tibia.
Intramedullary rod is in place with 2 proximal and 2 distal
interlocking cortical screws. Improved alignment about the distal
shaft spiral fracture. Visible hardware appears intact.
IMPRESSION: Left tibia ORIF with no adverse features identified.

## 2024-06-11 ENCOUNTER — Ambulatory Visit
Admission: EM | Admit: 2024-06-11 | Discharge: 2024-06-11 | Disposition: A | Discharge status: Home / Self Care | Payer: Self-pay

## 2024-06-11 DIAGNOSIS — I10 Essential (primary) hypertension: Secondary | ICD-10-CM

## 2024-06-11 DIAGNOSIS — J101 Influenza due to other identified influenza virus with other respiratory manifestations: Secondary | ICD-10-CM

## 2024-06-11 LAB — POCT RAPID STREP A (OFFICE): Rapid Strep A Screen: NEGATIVE

## 2024-06-11 LAB — POCT INFLUENZA A/B
Influenza A, POC: POSITIVE — AB
Influenza B, POC: NEGATIVE

## 2024-06-11 LAB — POC SOFIA SARS ANTIGEN FIA: SARS Coronavirus 2 Ag: NEGATIVE

## 2024-06-11 NOTE — ED Triage Notes (Signed)
 Sore throat, cough, congestion x 1 day. Taking ibuprofen.   Pt states his kids had flu A and B and strep this past week.   Pt took 2 home covid and flu test that were negative.

## 2024-06-11 NOTE — ED Provider Notes (Signed)
 " CAY RALPH PELT    CSN: [id number] Arrival date & time: 06/11/24  1557      History   Chief Complaint Chief Complaint  Patient presents with   Sore Throat    HPI Anthony Abbott is a 48 y.o. male.  Patient presents with 1 day history of sore throat, congestion, cough, body aches.  Treating with ibuprofen.  No fever, chest pain, shortness of breath.  He has family members that have recently had influenza A, influenza B, and strep.  His medical history includes hypertension.  The history is provided by the patient and medical records.    Past Medical History:  Diagnosis Date   Hypertension     Patient Active Problem List   Diagnosis Date Noted   Closed left tibial fracture 04/26/2018    Past Surgical History:  Procedure Laterality Date   TIBIA IM NAIL INSERTION Left 04/27/2018   Procedure: INTRAMEDULLARY (IM) NAIL TIBIAL;  Surgeon: Edie Norleen PARAS, MD;  Location: [facility] ORS;  Service: Orthopedics;  Laterality: Left;       Home Medications    Prior to Admission medications  Medication Sig Start Date End Date Taking? Authorizing Provider  losartan (COZAAR) 25 MG tablet Take 25 mg by mouth daily. 03/31/18  Yes [provider]  PARoxetine (PAXIL) 10 MG tablet Take 10 mg by mouth. 04/15/24  Yes [provider]  aspirin  EC 325 MG tablet Take 1 tablet (325 mg total) by mouth daily. 04/28/18   Kip Lynwood Double, PA-C  ibuprofen (ADVIL,MOTRIN) 200 MG tablet Take 200-400 mg by mouth every 6 (six) hours as needed for mild pain.    [provider]  oxyCODONE  (OXY IR/ROXICODONE ) 5 MG immediate release tablet Take 1-2 tablets (5-10 mg total) by mouth every 4 (four) hours as needed for moderate pain (pain score 4-6). 04/28/18   Kip Lynwood Double, PA-C  traMADol  (ULTRAM ) 50 MG tablet Take 1 tablet (50 mg total) by mouth every 6 (six) hours as needed for moderate pain. 04/28/18   Kip Lynwood Double, PA-C    Family History History reviewed. No  pertinent family history.  Social History Social History[1]   Allergies   Escitalopram and Sulfa antibiotics   Review of Systems Review of Systems  Constitutional:  Negative for chills and fever.  HENT:  Positive for congestion and sore throat. Negative for ear pain.   Respiratory:  Positive for cough. Negative for shortness of breath.   Cardiovascular:  Negative for chest pain and palpitations.  Gastrointestinal:  Negative for diarrhea and vomiting.     Physical Exam Triage Vital Signs ED Triage Vitals  Encounter Vitals Group     BP 06/11/24 1642 (!) 145/97     Girls Systolic BP Percentile --      Girls Diastolic BP Percentile --      Boys Systolic BP Percentile --      Boys Diastolic BP Percentile --      Pulse Rate 06/11/24 1642 83     Resp 06/11/24 1642 16     Temp 06/11/24 1642 98 F (36.7 C)     Temp Source 06/11/24 1642 Oral     SpO2 06/11/24 1642 94 %     Weight --      Height --      Head Circumference --      Peak Flow --      Pain Score 06/11/24 1640 4     Pain Loc --      Pain  Education --      Exclude from [employer] Chart --    No data found.  Updated Vital Signs BP (!) 152/92 (BP Location: Right Arm)   Pulse 83   Temp 98 F (36.7 C) (Oral)   Resp 16   SpO2 94%   Visual Acuity Right Eye Distance:   Left Eye Distance:   Bilateral Distance:    Right Eye Near:   Left Eye Near:    Bilateral Near:     Physical Exam Constitutional:      General: He is not in acute distress. HENT:     Right Ear: Tympanic membrane normal.     Left Ear: Tympanic membrane normal.     Nose: Rhinorrhea present.     Mouth/Throat:     Mouth: Mucous membranes are moist.     Pharynx: Oropharynx is clear.  Cardiovascular:     Rate and Rhythm: Normal rate and regular rhythm.     Heart sounds: Normal heart sounds.  Pulmonary:     Effort: Pulmonary effort is normal. No respiratory distress.     Breath sounds: Normal breath sounds.  Neurological:     Mental Status:  He is alert.      UC Treatments / Results  Labs (all labs ordered are listed, but only abnormal results are displayed) Labs Reviewed  POCT INFLUENZA A/B - Abnormal; Notable for the following components:      Result Value   Influenza A, POC Positive (*)    All other components within normal limits  POC SOFIA SARS ANTIGEN FIA - Normal  POCT RAPID STREP A (OFFICE) - Normal    EKG   Radiology No results found.  Procedures Procedures (including critical care time)  Medications Ordered in UC Medications - No data to display  Initial Impression / Assessment and Plan / UC Course  I have reviewed the triage vital signs and the nursing notes.  Pertinent labs & imaging results that were available during my care of the patient were reviewed by me and considered in my medical decision making (see chart for details).    Influenza A, elevated blood pressure reading with hypertension.  Flu test positive for influenza A.  COVID and strep tests are negative.  Patient declines treatment with flu antiviral.  He prefers to continue symptomatic treatment including Tylenol  or ibuprofen as needed, rest, hydration.  Education provided on influenza.  Instructed him to follow-up with his PCP if he is not improving.  Also discussed with patient that his blood pressure is elevated today and needs to be rechecked by his PCP.  Education provided on managing hypertension.  He agrees to plan of care.  Final Clinical Impressions(s) / UC Diagnoses   Final diagnoses:  Influenza A  Elevated blood pressure reading in office with diagnosis of hypertension     Discharge Instructions      Your flu test is positive for influenza A.  COVID is negative.  Take Tylenol  or ibuprofen as needed for fever or discomfort.  Rest to keep yourself hydrated.  Follow-up with your primary care provider if your symptoms are not improving.  Your blood pressure is elevated today at 145/97; repeat 152/92.  Please have this  rechecked by your primary care provider in 2-4 weeks.          ED Prescriptions   None    PDMP not reviewed this encounter.    [1]  Social History Tobacco Use   Smoking status: Former   Smokeless  tobacco: Never  Substance Use Topics   Alcohol use: Yes    Comment: Socially   Drug use: Not Currently     Corlis Burnard DEL, NP 06/11/24 1736  "

## 2024-06-11 NOTE — Discharge Instructions (Addendum)
 Your flu test is positive for influenza A.  COVID is negative.  Take Tylenol  or ibuprofen as needed for fever or discomfort.  Rest to keep yourself hydrated.  Follow-up with your primary care provider if your symptoms are not improving.  Your blood pressure is elevated today at 145/97; repeat 152/92.  Please have this rechecked by your primary care provider in 2-4 weeks.
